# Patient Record
Sex: Male | Born: 1980 | Race: White | Hispanic: No | Marital: Married | Smoking: Never smoker
Health system: Southern US, Community
[De-identification: ages and names within clinical notes are randomized; demographics above are authoritative.]

## PROBLEM LIST (undated history)

## (undated) DIAGNOSIS — J45909 Unspecified asthma, uncomplicated: Secondary | ICD-10-CM

## (undated) DIAGNOSIS — T7840XA Allergy, unspecified, initial encounter: Secondary | ICD-10-CM

## (undated) HISTORY — DX: Allergy, unspecified, initial encounter: T78.40XA

## (undated) HISTORY — PX: ANTERIOR CRUCIATE LIGAMENT REPAIR: SHX115

## (undated) HISTORY — PX: EYE SURGERY: SHX253

---

## 2001-04-20 HISTORY — PX: KNEE SURGERY: SHX244

## 2014-10-15 DIAGNOSIS — T7840XA Allergy, unspecified, initial encounter: Secondary | ICD-10-CM | POA: Insufficient documentation

## 2014-10-16 ENCOUNTER — Ambulatory Visit: Payer: Self-pay | Admitting: Family Medicine

## 2015-04-09 ENCOUNTER — Telehealth: Payer: Self-pay | Admitting: Unknown Physician Specialty

## 2015-04-09 NOTE — Telephone Encounter (Signed)
Pt has sore throat and is coughing and he's not sure if it's viral. He has a 34 year old and he just wondered if this was something he needed to be worried about.

## 2015-04-09 NOTE — Telephone Encounter (Signed)
I believe this is your patient. Looking in PP, he used to see [place], saw Dr. Sherie DonLada twice, and [facility]

## 2015-04-09 NOTE — Telephone Encounter (Signed)
He woke up yesterday with a bit of a sore throat; then went away and was feeling better; little bit of cough; just scratchy, better through the day; no fever; no rash; no sinus or ear problems; discussed may be viral, symptomatic care encouraged; if sore throat worse, then consider strep, would need to be evaluated and treated in case strep, untreated strep can cause rheumatic fever; contagious, good hygiene around child discussed; we can see him if needed; he thanked me for call

## 2015-04-09 NOTE — Telephone Encounter (Signed)
Routing to provider for advice.

## 2015-05-15 ENCOUNTER — Emergency Department
Admission: EM | Admit: 2015-05-15 | Discharge: 2015-05-15 | Disposition: A | Discharge status: Home / Self Care | Payer: BC Managed Care – PPO | Attending: Emergency Medicine | Admitting: Emergency Medicine

## 2015-05-15 DIAGNOSIS — K529 Noninfective gastroenteritis and colitis, unspecified: Secondary | ICD-10-CM | POA: Insufficient documentation

## 2015-05-15 DIAGNOSIS — E860 Dehydration: Secondary | ICD-10-CM | POA: Insufficient documentation

## 2015-05-15 DIAGNOSIS — B349 Viral infection, unspecified: Secondary | ICD-10-CM | POA: Diagnosis not present

## 2015-05-15 DIAGNOSIS — R112 Nausea with vomiting, unspecified: Secondary | ICD-10-CM | POA: Diagnosis present

## 2015-05-15 HISTORY — DX: Unspecified asthma, uncomplicated: J45.909

## 2015-05-15 LAB — CBC
HCT: 50.8 % (ref 40.0–52.0)
Hemoglobin: 16.7 g/dL (ref 13.0–18.0)
MCH: 28.8 pg (ref 26.0–34.0)
MCHC: 32.8 g/dL (ref 32.0–36.0)
MCV: 87.9 fL (ref 80.0–100.0)
PLATELETS: 236 10*3/uL (ref 150–440)
RBC: 5.78 MIL/uL (ref 4.40–5.90)
RDW: 12.6 % (ref 11.5–14.5)
WBC: 15 10*3/uL — AB (ref 3.8–10.6)

## 2015-05-15 LAB — BASIC METABOLIC PANEL
ANION GAP: 7 (ref 5–15)
BUN: 19 mg/dL (ref 6–20)
CALCIUM: 9.5 mg/dL (ref 8.9–10.3)
CO2: 27 mmol/L (ref 22–32)
Chloride: 103 mmol/L (ref 101–111)
Creatinine, Ser: 1.49 mg/dL — ABNORMAL HIGH (ref 0.61–1.24)
GFR, EST NON AFRICAN AMERICAN: 60 mL/min — AB (ref >60)
Glucose, Bld: 140 mg/dL — ABNORMAL HIGH (ref 65–99)
POTASSIUM: 5.3 mmol/L — AB (ref 3.5–5.1)
SODIUM: 137 mmol/L (ref 135–145)

## 2015-05-15 MED ORDER — SODIUM CHLORIDE 0.9 % IV BOLUS (SEPSIS)
1000.0000 mL | Freq: Once | INTRAVENOUS | Status: AC
  Administered 2015-05-15: 1000 mL via INTRAVENOUS

## 2015-05-15 MED ORDER — ONDANSETRON 8 MG PO TBDP: 8.0000 mg | ORAL_TABLET | Freq: Three times a day (TID) | ORAL | Status: DC | PRN

## 2015-05-15 MED ORDER — ONDANSETRON HCL 4 MG/2ML IJ SOLN
4.0000 mg | Freq: Once | INTRAMUSCULAR | Status: AC
  Administered 2015-05-15: 4 mg via INTRAVENOUS
  Filled 2015-05-15: qty 2

## 2015-05-15 MED ORDER — RANITIDINE HCL 150 MG PO CAPS: 150.0000 mg | ORAL_CAPSULE | Freq: Two times a day (BID) | ORAL | Status: DC

## 2015-05-15 MED ORDER — DEXAMETHASONE SODIUM PHOSPHATE 10 MG/ML IJ SOLN
10.0000 mg | Freq: Once | INTRAMUSCULAR | Status: AC
  Administered 2015-05-15: 10 mg via INTRAVENOUS
  Filled 2015-05-15 (×2): qty 1

## 2015-05-15 NOTE — ED Notes (Signed)
Pt arrived via EMS with 18 ga in left ac.   IV was removed at this time, catheter intact.  Site benign.   Dressing applied. Pt states that he is feeling much better.

## 2015-05-15 NOTE — ED Notes (Signed)
Patient to ED via EMS for vomiting that started around 5pm tonight. States he has been at  [employer] and returned home at 0600 this morning. Also is checking his young son in with same symptoms. Patient appears well, skin normal color for ethnicity, mucus membranes moist. Not actively vomiting in triage.

## 2015-05-15 NOTE — Discharge Instructions (Signed)
Dehydration, Adult °Dehydration is a condition in which you do not have enough fluid or water in your body. It happens when you take in less fluid than you lose. Vital organs such as the kidneys, brain, and heart cannot function without a proper amount of fluids. Any loss of fluids from the body can cause dehydration.  °Dehydration can range from mild to severe. This condition should be treated right away to help prevent it from becoming severe. °CAUSES  °This condition may be caused by: °· Vomiting. °· Diarrhea. °· Excessive sweating, such as when exercising in hot or humid weather. °· Not drinking enough fluid during strenuous exercise or during an illness. °· Excessive urine output. °· Fever. °· Certain medicines. °RISK FACTORS °This condition is more likely to develop in: °· People who are taking certain medicines that cause the body to lose excess fluid (diuretics).   °· People who have a chronic illness, such as diabetes, that may increase urination. °· Older adults.   °· People who live at high altitudes.   °· People who participate in endurance sports.   °SYMPTOMS  °Mild Dehydration °· Thirst. °· Dry lips. °· Slightly dry mouth. °· Dry, warm skin. °Moderate Dehydration °· Very dry mouth.   °· Muscle cramps.   °· Dark urine and decreased urine production.   °· Decreased tear production.   °· Headache.   °· Light-headedness, especially when you stand up from a sitting position.   °Severe Dehydration °· Changes in skin.   °· Cold and clammy skin.   °· Skin does not spring back quickly when lightly pinched and released.   °· Changes in body fluids.   °· Extreme thirst.   °· No tears.   °· Not able to sweat when body temperature is high, such as in hot weather.   °· Minimal urine production.   °· Changes in vital signs.   °· Rapid, weak pulse (more than 100 beats per minute when you are sitting still).   °· Rapid breathing.   °· Low blood pressure.   °· Other changes.   °· Sunken eyes.   °· Cold hands and feet.    °· Confusion. °· Lethargy and difficulty being awakened. °· Fainting (syncope).   °· Short-term weight loss.   °· Unconsciousness. °DIAGNOSIS  °This condition may be diagnosed based on your symptoms. You may also have tests to determine how severe your dehydration is. These tests may include:  °· Urine tests.   °· Blood tests.   °TREATMENT  °Treatment for this condition depends on the severity. Mild or moderate dehydration can often be treated at home. Treatment should be started right away. Do not wait until dehydration becomes severe. Severe dehydration needs to be treated at the hospital. °Treatment for Mild Dehydration °· Drinking plenty of water to replace the fluid you have lost.   °· Replacing minerals in your blood (electrolytes) that you may have lost.   °Treatment for Moderate Dehydration  °· Consuming oral rehydration solution (ORS). °Treatment for Severe Dehydration °· Receiving fluid through an IV tube.   °· Receiving electrolyte solution through a feeding tube that is passed through your nose and into your stomach (nasogastric tube or NG tube). °· Correcting any abnormalities in electrolytes. °HOME CARE INSTRUCTIONS  °· Drink enough fluid to keep your urine clear or pale yellow.   °· Drink water or fluid slowly by taking small sips. You can also try sucking on ice cubes.  °· Have food or beverages that contain electrolytes. Examples include bananas and sports drinks. °· Take over-the-counter and prescription medicines only as told by your health care provider.   °· Prepare ORS according to the manufacturer's instructions. Take sips   of ORS every 5 minutes until your urine returns to normal. °· If you have vomiting or diarrhea, continue to try to drink water, ORS, or both.   °· If you have diarrhea, avoid:   °¨ Beverages that contain caffeine.   °¨ Fruit juice.   °¨ Milk.    °¨ Carbonated soft drinks. °· Do not take salt tablets. This can lead to the condition of having too much sodium in your body  (hypernatremia).   °SEEK MEDICAL CARE IF: °· You cannot eat or drink without vomiting. °· You have had moderate diarrhea during a period of more than 24 hours. °· You have a fever. °SEEK IMMEDIATE MEDICAL CARE IF:  °· You have extreme thirst. °· You have severe diarrhea. °· You have not urinated in 6-8 hours, or you have urinated only a small amount of very dark urine. °· You have shriveled skin. °· You are dizzy, confused, or both. °  °This information is not intended to replace advice given to you by your health care provider. Make sure you discuss any questions you have with your health care provider. °  °Document Released: 04/06/2005 Document Revised: 12/26/2014 Document Reviewed: 08/22/2014 °Elsevier Interactive Patient Education ©2016 [employer] ° °Viral Infections °A viral infection can be caused by different types of viruses. Most viral infections are not serious and resolve on their own. However, some infections may cause severe symptoms and may lead to further complications. °SYMPTOMS °Viruses can frequently cause: °· Minor sore throat. °· Aches and pains. °· Headaches. °· Runny nose. °· Different types of rashes. °· Watery eyes. °· Tiredness. °· Cough. °· Loss of appetite. °· Gastrointestinal infections, resulting in nausea, vomiting, and diarrhea. °These symptoms do not respond to antibiotics because the infection is not caused by bacteria. However, you might catch a bacterial infection following the viral infection. This is sometimes called a "superinfection." Symptoms of such a bacterial infection may include: °· Worsening sore throat with pus and difficulty swallowing. °· Swollen neck glands. °· Chills and a high or persistent fever. °· Severe headache. °· Tenderness over the sinuses. °· Persistent overall ill feeling (malaise), muscle aches, and tiredness (fatigue). °· Persistent cough. °· Yellow, green, or brown mucus production with coughing. °HOME CARE INSTRUCTIONS  °· Only take  over-the-counter or prescription medicines for pain, discomfort, diarrhea, or fever as directed by your caregiver. °· Drink enough water and fluids to keep your urine clear or pale yellow. Sports drinks can provide valuable electrolytes, sugars, and hydration. °· Get plenty of rest and maintain proper nutrition. Soups and broths with crackers or rice are fine. °SEEK IMMEDIATE MEDICAL CARE IF:  °· You have severe headaches, shortness of breath, chest pain, neck pain, or an unusual rash. °· You have uncontrolled vomiting, diarrhea, or you are unable to keep down fluids. °· You or your child has an oral temperature above 102° F (38.9° C), not controlled by medicine. °· Your baby is older than 3 months with a rectal temperature of 102° F (38.9° C) or higher. °· Your baby is 3 months old or younger with a rectal temperature of 100.4° F (38° C) or higher. °MAKE SURE YOU:  °· Understand these instructions. °· Will watch your condition. °· Will get help right away if you are not doing well or get worse. °  °This information is not intended to replace advice given to you by your health care provider. Make sure you discuss any questions you have with your health care provider. °  °Document Released: 01/14/2005 Document Revised: 06/29/2011 Document   Reviewed: 09/12/2014 °Elsevier Interactive Patient Education ©2016 [employer] ° °

## 2015-05-15 NOTE — ED Provider Notes (Signed)
[facility] Emergency Department Provider Note  ____________________________________________  Time seen: 10:00 PM  I have reviewed the triage vital signs and the nursing notes.   HISTORY  Chief Complaint Emesis    HPI Kenneth Schnebly is a 35 y.o. male who complains of nausea vomiting and diarrhea today. He recently traveled with his family to [employer] and since returning home, he, his son, and his mother-in-law are all sick with the same symptoms. He has generalized abdominal discomfort and fatigue. Denies fever, but while being upright earlier today he fell very lightheaded and thought he was going to pass out.     Past Medical History  Diagnosis Date  . Allergy   . Asthma      Patient Active Problem List   Diagnosis Date Noted  . Allergy      Past Surgical History  Procedure Laterality Date  . Knee surgery  2003    ACL reconstruction  . Eye surgery      retinal repair     Current Outpatient Rx  Name  Route  Sig  Dispense  Refill  . ondansetron (ZOFRAN ODT) 8 MG disintegrating tablet   Oral   Take 1 tablet (8 mg total) by mouth every 8 (eight) hours as needed for nausea or vomiting.   20 tablet   0   . ranitidine (ZANTAC) 150 MG capsule   Oral   Take 1 capsule (150 mg total) by mouth 2 (two) times daily.   28 capsule   0      Allergies Review of patient's allergies indicates no known allergies.   No family history on file.  Social History Social History  Substance Use Topics  . Smoking status: Never Smoker   . Smokeless tobacco: Never Used  . Alcohol Use: Yes     Comment:   Occasionally    Review of Systems  Constitutional:   No fever or chills. No weight changes Eyes:   No blurry vision or double vision.  ENT:   No sore throat. Cardiovascular:   No chest pain. Respiratory:   No dyspnea or cough. Gastrointestinal:   Generalized abdominal pain with vomiting and diarrhea.  No BRBPR or melena. Genitourinary:    Negative for dysuria, urinary retention, bloody urine, or difficulty urinating. Musculoskeletal:   Negative for back pain. No joint swelling or pain. Skin:   Negative for rash. Neurological:   Negative for headaches, focal weakness or numbness. Psychiatric:  No anxiety or depression.   Endocrine:  No hot/cold intolerance, changes in energy, or sleep difficulty.  10-point ROS otherwise negative.  ____________________________________________   PHYSICAL EXAM:  VITAL SIGNS: ED Triage Vitals  Enc Vitals Group     BP 05/15/15 2025 99/61 mmHg     Pulse Rate 05/15/15 2025 65     Resp 05/15/15 2025 18     Temp 05/15/15 2025 98.2 F (36.8 C)     Temp Source 05/15/15 2025 Oral     SpO2 05/15/15 2025 96 %     Weight 05/15/15 2026 188 lb (85.276 kg)     Height 05/15/15 2026  (1.854 m)     Head Cir --      Peak Flow --      Pain Score 05/15/15 2026 0     Pain Loc --      Pain Edu? --      Excl. in GC? --     Vital signs reviewed, nursing assessments reviewed.   Constitutional:  Alert and oriented. Well appearing and in no distress. Eyes:   No scleral icterus. No conjunctival pallor. PERRL. EOMI ENT   Head:   Normocephalic and atraumatic.   Nose:   No congestion/rhinnorhea. No septal hematoma   Mouth/Throat:   MMM, positive pharyngeal erythema. No peritonsillar mass. No uvula shift.   Neck:   No stridor. No SubQ emphysema. No meningismus. Hematological/Lymphatic/Immunilogical:   No cervical lymphadenopathy. Cardiovascular:   RRR. Normal and symmetric distal pulses are present in all extremities. No murmurs, rubs, or gallops. Respiratory:   Normal respiratory effort without tachypnea nor retractions. Breath sounds are clear and equal bilaterally. No wheezes/rales/rhonchi. Gastrointestinal:   Soft with mild generalized tenderness. No focal findings. No distention. There is no CVA tenderness.  No rebound, rigidity, or guarding. Genitourinary:    deferred Musculoskeletal:   Nontender with normal range of motion in all extremities. No joint effusions.  No lower extremity tenderness.  No edema. Neurologic:   Normal speech and language.  CN 2-10 normal. Motor grossly intact. No pronator drift.  Normal gait. No gross focal neurologic deficits are appreciated.  Skin:    Skin is warm, dry and intact. No rash noted.  No petechiae, purpura, or bullae. Psychiatric:   Mood and affect are normal. Speech and behavior are normal. Patient exhibits appropriate insight and judgment.  ____________________________________________    LABS (pertinent positives/negatives) (all labs ordered are listed, but only abnormal results are displayed) Labs Reviewed  CBC - Abnormal; Notable for the following:    WBC 15.0 (*)    All other components within normal limits  BASIC METABOLIC PANEL - Abnormal; Notable for the following:    Potassium 5.3 (*)    Glucose, Bld 140 (*)    Creatinine, Ser 1.49 (*)    GFR calc non Af Amer 60 (*)    All other components within normal limits   ____________________________________________   EKG    ____________________________________________    RADIOLOGY    ____________________________________________   PROCEDURES   ____________________________________________   INITIAL IMPRESSION / ASSESSMENT AND PLAN / ED COURSE  Pertinent labs & imaging results that were available during my care of the patient were reviewed by me and considered in my medical decision making (see chart for details).  Patient presents with nausea vomiting diarrhea and generalized abdominal pain this setting of recent travel to [place] and multiple sick contacts. This is consistent with Arna Medici virus and gastroenteritis. We'll give Decadron and Zofran and IV fluids especially since he was subclinically orthostatic on nursing assessment.Considering the patient's symptoms, medical history, and physical examination today, I have low suspicion  for cholecystitis or biliary pathology, pancreatitis, perforation or bowel obstruction, hernia, intra-abdominal abscess, AAA or dissection, volvulus or intussusception, or appendicitis.  Expect the patient to be discharged home after receiving fluids and I he'll feel much better. We'll continue Zantac and Zofran for symptomatic relief at home.     ____________________________________________   FINAL CLINICAL IMPRESSION(S) / ED DIAGNOSES  Final diagnoses:  Viral syndrome  Dehydration  Gastroenteritis      Sharman Cheek, MD 05/15/15 2226

## 2015-05-15 NOTE — ED Notes (Signed)
ADDENDUM: Patient also states he was wheezing at the time of vomiting and had to use his inhaler while he was on the phone with 911.

## 2015-05-15 NOTE — ED Notes (Signed)
Called for pt in the lobby, but did not get an answer.

## 2015-09-10 ENCOUNTER — Encounter: Payer: Self-pay | Admitting: Family Medicine

## 2015-09-10 ENCOUNTER — Ambulatory Visit (INDEPENDENT_AMBULATORY_CARE_PROVIDER_SITE_OTHER): Payer: BC Managed Care – PPO | Admitting: Family Medicine

## 2015-09-10 VITALS — BP 145/80 | HR 109 | Temp 99.1°F | Ht 73.0 in | Wt 191.6 lb

## 2015-09-10 DIAGNOSIS — J069 Acute upper respiratory infection, unspecified: Secondary | ICD-10-CM | POA: Diagnosis not present

## 2015-09-10 NOTE — Progress Notes (Signed)
BP 145/80 mmHg  Pulse 109  Temp(Src) 99.1 F (37.3 C)  Ht 6\' 1"  (1.854 m)  Wt 191 lb 9.6 oz (86.909 kg)  BMI 25.28 kg/m2  SpO2 97%   Subjective:    Patient ID: Kenneth Rivers, male    DOB: [birth year], 35 y.o.   MRN: [id number]  HPI: Kenneth Rivers is a 35 y.o. male  Chief Complaint  Patient presents with  . Hoarse  . Chills  . Fatigue  . Generalized Body Aches  . Headache   UPPER RESPIRATORY TRACT INFECTION Duration: 1 day Worst symptom: hoarseness, chills and sweats Fever: no Cough: yes Shortness of breath: yes Wheezing: no Chest pain: no Chest tightness: no Chest congestion: no Nasal congestion: yes Runny nose: yes Post nasal drip: no Sneezing: yes Sore throat: no Swollen glands: no Sinus pressure: yes Headache: yes Face pain: no Toothache: no Ear pain: no  Ear pressure: no  Eyes red/itching:no Eye drainage/crusting: no  Vomiting: no Rash: no Fatigue: yes Sick contacts: yes Strep contacts: no  Context: worse Recurrent sinusitis: no Relief with OTC cold/cough medications: no  Treatments attempted: dayquil   Relevant past medical, surgical, family and social history reviewed and updated as indicated. Interim medical history since our last visit reviewed. Allergies and medications reviewed and updated.  Review of Systems  Constitutional: Positive for fever, chills, diaphoresis and fatigue. Negative for activity change, appetite change and unexpected weight change.  HENT: Positive for congestion, rhinorrhea, sinus pressure, sneezing and voice change. Negative for dental problem, drooling, ear discharge, ear pain, facial swelling, hearing loss, mouth sores, nosebleeds, postnasal drip, sore throat and trouble swallowing.   Respiratory: Positive for cough. Negative for apnea, choking, chest tightness, shortness of breath, wheezing and stridor.   Cardiovascular: Negative.   Psychiatric/Behavioral: Negative.     Per HPI unless specifically indicated  above     Objective:    BP 145/80 mmHg  Pulse 109  Temp(Src) 99.1 F (37.3 C)  Ht 6\' 1"  (1.854 m)  Wt 191 lb 9.6 oz (86.909 kg)  BMI 25.28 kg/m2  SpO2 97%  Wt Readings from Last 3 Encounters:  09/10/15 191 lb 9.6 oz (86.909 kg)  05/15/15 188 lb (85.276 kg)  06/07/14 193 lb (87.544 kg)    Physical Exam  Constitutional: He is oriented to person, place, and time. He appears well-developed and well-nourished. No distress.  HENT:  Head: Normocephalic and atraumatic.  Right Ear: Hearing, tympanic membrane, external ear and ear canal normal.  Left Ear: Hearing, tympanic membrane, external ear and ear canal normal.  Nose: Mucosal edema and rhinorrhea present. Right sinus exhibits no maxillary sinus tenderness and no frontal sinus tenderness. Left sinus exhibits no maxillary sinus tenderness and no frontal sinus tenderness.  Mouth/Throat: Uvula is midline, oropharynx is clear and moist and mucous membranes are normal. No oropharyngeal exudate.  Eyes: Conjunctivae, EOM and lids are normal. Pupils are equal, round, and reactive to light. Right eye exhibits no discharge. Left eye exhibits no discharge. No scleral icterus.  Neck: Normal range of motion. Neck supple. No JVD present. No tracheal deviation present. No thyromegaly present.  Cardiovascular: Normal rate, regular rhythm, normal heart sounds and intact distal pulses.  Exam reveals no gallop and no friction rub.   No murmur heard. Pulmonary/Chest: Effort normal and breath sounds normal. No stridor. No respiratory distress. He has no wheezes. He has no rales. He exhibits no tenderness.  Musculoskeletal: Normal range of motion.  Lymphadenopathy:    He has cervical adenopathy.  Neurological: He is alert and oriented to person, place, and time.  Skin: Skin is warm, dry and intact. No rash noted. He is not diaphoretic. No erythema. No pallor.  Psychiatric: He has a normal mood and affect. His speech is normal and behavior is normal.  Judgment and thought content normal. Cognition and memory are normal.  Nursing note and vitals reviewed.   Results for orders placed or performed during the hospital encounter of 05/15/15  CBC  Result Value Ref Range   WBC 15.0 (H) 3.8 - 10.6 K/uL   RBC 5.78 4.40 - 5.90 MIL/uL   Hemoglobin 16.7 13.0 - 18.0 g/dL   HCT 16.1 09.6 - 04.5 %   MCV 87.9 80.0 - 100.0 fL   MCH 28.8 26.0 - 34.0 pg   MCHC 32.8 32.0 - 36.0 g/dL   RDW 40.9 81.1 - 91.4 %   Platelets 236 150 - 440 K/uL  Basic metabolic panel  Result Value Ref Range   Sodium 137 135 - 145 mmol/L   Potassium 5.3 (H) 3.5 - 5.1 mmol/L   Chloride 103 101 - 111 mmol/L   CO2 27 22 - 32 mmol/L   Glucose, Bld 140 (H) 65 - 99 mg/dL   BUN 19 6 - 20 mg/dL   Creatinine, Ser 7.82 (H) 0.61 - 1.24 mg/dL   Calcium 9.5 8.9 - 95.6 mg/dL   GFR calc non Af Amer 60 (L) >60 mL/min   GFR calc Af Amer >60 >60 mL/min   Anion gap 7 5 - 15      Assessment & Plan:   Problem List Items Addressed This Visit    None    Visit Diagnoses    Upper respiratory infection    -  Primary    Rest and fluids. Out of work until Friday. Call if not getting better or getting worse.         Follow up plan: Return if symptoms worsen or fail to improve.

## 2015-09-10 NOTE — Patient Instructions (Signed)

## 2016-04-17 ENCOUNTER — Encounter: Payer: Self-pay | Admitting: Emergency Medicine

## 2016-04-17 ENCOUNTER — Emergency Department
Admission: EM | Admit: 2016-04-17 | Discharge: 2016-04-17 | Disposition: A | Discharge status: Home / Self Care | Payer: BC Managed Care – PPO | Attending: Emergency Medicine | Admitting: Emergency Medicine

## 2016-04-17 DIAGNOSIS — R42 Dizziness and giddiness: Secondary | ICD-10-CM | POA: Insufficient documentation

## 2016-04-17 DIAGNOSIS — J45909 Unspecified asthma, uncomplicated: Secondary | ICD-10-CM | POA: Diagnosis not present

## 2016-04-17 LAB — CBC
HCT: 43.3 % (ref 40.0–52.0)
HEMOGLOBIN: 14.6 g/dL (ref 13.0–18.0)
MCH: 30.4 pg (ref 26.0–34.0)
MCHC: 33.7 g/dL (ref 32.0–36.0)
MCV: 90 fL (ref 80.0–100.0)
Platelets: 251 10*3/uL (ref 150–440)
RBC: 4.82 MIL/uL (ref 4.40–5.90)
RDW: 13 % (ref 11.5–14.5)
WBC: 9.3 10*3/uL (ref 3.8–10.6)

## 2016-04-17 LAB — URINALYSIS, COMPLETE (UACMP) WITH MICROSCOPIC
BILIRUBIN URINE: NEGATIVE
Bacteria, UA: NONE SEEN
Glucose, UA: NEGATIVE mg/dL
HGB URINE DIPSTICK: NEGATIVE
Ketones, ur: NEGATIVE mg/dL
LEUKOCYTES UA: NEGATIVE
NITRITE: NEGATIVE
PROTEIN: NEGATIVE mg/dL
Specific Gravity, Urine: 1.005 (ref 1.005–1.030)
Squamous Epithelial / LPF: NONE SEEN
pH: 7 (ref 5.0–8.0)

## 2016-04-17 LAB — BASIC METABOLIC PANEL
ANION GAP: 5 (ref 5–15)
BUN: 24 mg/dL — ABNORMAL HIGH (ref 6–20)
CALCIUM: 9.1 mg/dL (ref 8.9–10.3)
CO2: 28 mmol/L (ref 22–32)
Chloride: 106 mmol/L (ref 101–111)
Creatinine, Ser: 1.04 mg/dL (ref 0.61–1.24)
Glucose, Bld: 104 mg/dL — ABNORMAL HIGH (ref 65–99)
Potassium: 3.6 mmol/L (ref 3.5–5.1)
Sodium: 139 mmol/L (ref 135–145)

## 2016-04-17 MED ORDER — MECLIZINE HCL 25 MG PO TABS: 25.0000 mg | ORAL_TABLET | Freq: Three times a day (TID) | ORAL | 0 refills | Status: DC | PRN

## 2016-04-17 MED ORDER — SODIUM CHLORIDE 0.9 % IV BOLUS (SEPSIS)
1000.0000 mL | Freq: Once | INTRAVENOUS | Status: AC
  Administered 2016-04-17: 1000 mL via INTRAVENOUS

## 2016-04-17 NOTE — ED Notes (Signed)
Patient will follow up with PCP.

## 2016-04-17 NOTE — ED Notes (Signed)
Attempted IV x2. 

## 2016-04-17 NOTE — ED Triage Notes (Signed)
Pt. States dizziness that started around 19:00 this evening.  Pt. States he ate at [employer] around 18:30.

## 2016-04-17 NOTE — ED Provider Notes (Signed)
[facility] Emergency Department Provider Note  Time seen: 10:38 PM  I have reviewed the triage vital signs and the nursing notes.   HISTORY  Chief Complaint Dizziness (Pt. here via EMS for dizziness.)    HPI Kenneth Rivers is a 35 y.o. male the patient presents the emergency department for dizziness. According to the patient he was sitting at home when he suddenly began feeling dizzy and lightheaded. Denies any chest pain, trouble breathing, nausea, diaphoresis. Denies any recent nausea, vomiting, diarrhea. Denies fever cough or congestion. Patient states mild dizziness currently but feels much better.  Past Medical History:  Diagnosis Date  . Allergy   . Asthma     Patient Active Problem List   Diagnosis Date Noted  . Allergy     Past Surgical History:  Procedure Laterality Date  . ANTERIOR CRUCIATE LIGAMENT REPAIR     rt. knee  . EYE SURGERY     retinal repair  . KNEE SURGERY  2003   ACL reconstruction    Prior to Admission medications   Medication Sig Start Date End Date Taking? Authorizing Provider  cetirizine (ZYRTEC) 10 MG tablet Take 10 mg by mouth daily.    Historical Provider, MD  fluticasone (FLONASE) 50 MCG/ACT nasal spray Place 1 spray into both nostrils daily.    Historical Provider, MD    No Known Allergies  History reviewed. No pertinent family history.  Social History Social History  Substance Use Topics  . Smoking status: Never Smoker  . Smokeless tobacco: Never Used  . Alcohol use Yes     Comment:   Occasionally    Review of Systems Constitutional: Negative for fever. Positive for dizziness, largely resolved. Cardiovascular: Negative for chest pain. Respiratory: Negative for shortness of breath. Gastrointestinal: Negative for abdominal pain Neurological: Negative for headache 10-point ROS otherwise negative.  ____________________________________________   PHYSICAL EXAM:  VITAL SIGNS: ED Triage Vitals   Enc Vitals Group     BP 04/17/16 2057 (!) 147/97     Pulse Rate 04/17/16 2057 73     Resp 04/17/16 2057 18     Temp 04/17/16 2057 98.2 F (36.8 C)     Temp Source 04/17/16 2057 Oral     SpO2 04/17/16 2057 97 %     Weight 04/17/16 2058 190 lb (86.2 kg)     Height 04/17/16 2058 6\' 1"  (1.854 m)     Head Circumference --      Peak Flow --      Pain Score 04/17/16 2058 4     Pain Loc --      Pain Edu? --      Excl. in GC? --    Constitutional: Alert and oriented. Well appearing and in no distress. Eyes: Normal exam ENT   Head: Normocephalic and atraumatic.   Mouth/Throat: Mucous membranes are moist. Cardiovascular: Normal rate, regular rhythm. No murmur Respiratory: Normal respiratory effort without tachypnea nor retractions. Breath sounds are clear  Gastrointestinal: Soft and nontender. No distention.  Musculoskeletal: Nontender with normal range of motion in all extremities.  Neurologic:  Normal speech and language. No gross focal neurologic deficits  Skin:  Skin is warm, dry and intact.  Psychiatric: Mood and affect are normal.   ____________________________________________    EKG  EKG reviewed and interpreted by myself shows sinus rhythm at 73 bpm, narrow QRS, normal axis, normal intervals, no concerning ST changes.  INITIAL IMPRESSION / ASSESSMENT AND PLAN / ED COURSE  Pertinent labs & imaging  results that were available during my care of the patient were reviewed by me and considered in my medical decision making (see chart for details).  Patient presents the emergency department with acute onset of dizziness. Patient states his symptoms have largely resolved. Patient's labs are largely within normal limits. Patient appears well with a normal physical examination. We will IV hydrate, anticipate discharge home with PCP follow-up.  ____________________________________________   FINAL CLINICAL IMPRESSION(S) / ED DIAGNOSES  Dizziness    [name],  MD 04/17/16 2241

## 2016-04-17 NOTE — ED Notes (Signed)
Pt. Reports getting dizzy soon after eating tonight around 19:00.  Pt. Denies anyone in the house with same symptoms.  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 Did a CO test and it came back as 1.  Pt. Reports dizziness was at 7/10 at its worst, pt. Reports 4/10 while sitting in room at this time.

## 2017-05-03 ENCOUNTER — Encounter: Payer: Self-pay | Admitting: Family Medicine

## 2017-05-03 ENCOUNTER — Ambulatory Visit: Payer: BC Managed Care – PPO | Admitting: Family Medicine

## 2017-05-03 VITALS — BP 133/87 | HR 53 | Temp 98.5°F | Ht 71.2 in | Wt 198.5 lb

## 2017-05-03 DIAGNOSIS — Z8249 Family history of ischemic heart disease and other diseases of the circulatory system: Secondary | ICD-10-CM

## 2017-05-03 DIAGNOSIS — R2991 Unspecified symptoms and signs involving the musculoskeletal system: Secondary | ICD-10-CM

## 2017-05-03 NOTE — Assessment & Plan Note (Signed)
Given Marfanoid habitus and family history of AAAs, will obtain US to R/O AAA. Avoid tobacco. Will check labs as well. Call with any concerns. Continue to keep BP and cholesterol under good control.

## 2017-05-03 NOTE — Progress Notes (Signed)
BP 133/87   Pulse (!) 53   Temp 98.5 F (36.9 C) (Oral)   Ht 5' 11.2" (1.808 m)   Wt 198 lb 8 oz (90 kg)   SpO2 99%   BMI 27.53 kg/m    Subjective:    Patient ID: Kenneth Rivers, male    DOB: 08-Nov-1980, 37 y.o.   MRN: [id number]  HPI: Kenneth Rivers is a 37 y.o. male  Chief Complaint  Patient presents with  . Thoracic Aortic Dissection    pt states that his father just recently passed away from an aortic dissection and was told to be checked because it is heriditary   Dad just passed away at 60 from an aortic dissection. He also notes that his grandfather died very young and they think it was from an aneurysm. He is healthy. Has always been very tall and thin with long arms and legs. Feeling well today with no other concerns or complaints at this time.    Relevant past medical, surgical, family and social history reviewed and updated as indicated. Interim medical history since our last visit reviewed. Allergies and medications reviewed and updated.  Review of Systems  Constitutional: Negative.   Respiratory: Negative.   Cardiovascular: Negative.   Musculoskeletal: Negative.   Neurological: Negative.   Psychiatric/Behavioral: Negative.     Per HPI unless specifically indicated above     Objective:    BP 133/87   Pulse (!) 53   Temp 98.5 F (36.9 C) (Oral)   Ht 5' 11.2" (1.808 m)   Wt 198 lb 8 oz (90 kg)   SpO2 99%   BMI 27.53 kg/m   Wt Readings from Last 3 Encounters:  05/03/17 198 lb 8 oz (90 kg)  04/17/16 190 lb (86.2 kg)  09/10/15 191 lb 9.6 oz (86.9 kg)    Physical Exam  Constitutional: He is oriented to person, place, and time. He appears well-developed and well-nourished. No distress.  HENT:  Head: Normocephalic and atraumatic.  Right Ear: Hearing normal.  Left Ear: Hearing normal.  Nose: Nose normal.  Eyes: Conjunctivae and lids are normal. Right eye exhibits no discharge. Left eye exhibits no discharge. No scleral icterus.  Cardiovascular:  Normal rate, regular rhythm, normal heart sounds and intact distal pulses. Exam reveals no gallop and no friction rub.  No murmur heard. Pulmonary/Chest: Effort normal and breath sounds normal. No respiratory distress. He has no wheezes. He has no rales. He exhibits no tenderness.  Abdominal: Soft. Bowel sounds are normal. He exhibits no distension and no mass. There is no tenderness. There is no rebound and no guarding.  Musculoskeletal: Normal range of motion.  Neurological: He is alert and oriented to person, place, and time.  Skin: Skin is warm, dry and intact. No rash noted. He is not diaphoretic. No erythema. No pallor.  Psychiatric: He has a normal mood and affect. His speech is normal and behavior is normal. Judgment and thought content normal. Cognition and memory are normal.  Nursing note and vitals reviewed.   Results for orders placed or performed during the hospital encounter of 04/17/16  Basic metabolic panel  Result Value Ref Range   Sodium 139 135 - 145 mmol/L   Potassium 3.6 3.5 - 5.1 mmol/L   Chloride 106 101 - 111 mmol/L   CO2 28 22 - 32 mmol/L   Glucose, Bld 104 (H) 65 - 99 mg/dL   BUN 24 (H) 6 - 20 mg/dL   Creatinine, Ser 4.09 0.61 - 1.24  mg/dL   Calcium 9.1 8.9 - 40.910.3 mg/dL   GFR calc non Af Amer >60 >60 mL/min   GFR calc Af Amer >60 >60 mL/min   Anion gap 5 5 - 15  CBC  Result Value Ref Range   WBC 9.3 3.8 - 10.6 K/uL   RBC 4.82 4.40 - 5.90 MIL/uL   Hemoglobin 14.6 13.0 - 18.0 g/dL   HCT 81.143.3 91.440.0 - 78.252.0 %   MCV 90.0 80.0 - 100.0 fL   MCH 30.4 26.0 - 34.0 pg   MCHC 33.7 32.0 - 36.0 g/dL   RDW 95.613.0 21.311.5 - 08.614.5 %   Platelets 251 150 - 440 K/uL  Urinalysis, Complete w Microscopic  Result Value Ref Range   Color, Urine COLORLESS (A) YELLOW   APPearance CLEAR (A) CLEAR   Specific Gravity, Urine 1.005 1.005 - 1.030   pH 7.0 5.0 - 8.0   Glucose, UA NEGATIVE NEGATIVE mg/dL   Hgb urine dipstick NEGATIVE NEGATIVE   Bilirubin Urine NEGATIVE NEGATIVE   Ketones,  ur NEGATIVE NEGATIVE mg/dL   Protein, ur NEGATIVE NEGATIVE mg/dL   Nitrite NEGATIVE NEGATIVE   Leukocytes, UA NEGATIVE NEGATIVE   RBC / HPF 0-5 0 - 5 RBC/hpf   WBC, UA 0-5 0 - 5 WBC/hpf   Bacteria, UA NONE SEEN NONE SEEN   Squamous Epithelial / LPF NONE SEEN NONE SEEN      Assessment & Plan:   Problem List Items Addressed This Visit      Other   Family history of abdominal aortic aneurysm (AAA) - Primary    Given Marfanoid habitus and family history of AAAs, will obtain US to R/O AAA. Avoid tobacco. Will check labs as well. Call with any concerns. Continue to keep BP and cholesterol under good control.       Relevant Orders   CBC with Differential/Platelet   Comprehensive metabolic panel   Lipid Panel w/o Chol/HDL Ratio   TSH   UA/M w/rflx Culture, Routine   US Retroperitoneal Ltd    Other Visit Diagnoses    Marfanoid habitus       Relevant Orders   CBC with Differential/Platelet   Comprehensive metabolic panel   Lipid Panel w/o Chol/HDL Ratio   TSH   UA/M w/rflx Culture, Routine   US Retroperitoneal Ltd       Follow up plan: Return in about 4 weeks (around 05/31/2017) for Physical.

## 2017-05-11 ENCOUNTER — Other Ambulatory Visit: Payer: BC Managed Care – PPO

## 2017-05-11 ENCOUNTER — Telehealth: Payer: Self-pay | Admitting: Family Medicine

## 2017-05-11 ENCOUNTER — Ambulatory Visit
Admission: RE | Admit: 2017-05-11 | Discharge: 2017-05-11 | Disposition: A | Discharge status: Home / Self Care | Payer: BC Managed Care – PPO | Source: Ambulatory Visit | Attending: Family Medicine | Admitting: Family Medicine

## 2017-05-11 DIAGNOSIS — R2991 Unspecified symptoms and signs involving the musculoskeletal system: Secondary | ICD-10-CM | POA: Diagnosis not present

## 2017-05-11 DIAGNOSIS — Z8249 Family history of ischemic heart disease and other diseases of the circulatory system: Secondary | ICD-10-CM | POA: Insufficient documentation

## 2017-05-11 LAB — MICROSCOPIC EXAMINATION: Bacteria, UA: NONE SEEN

## 2017-05-11 LAB — UA/M W/RFLX CULTURE, ROUTINE
BILIRUBIN UA: NEGATIVE
Glucose, UA: NEGATIVE
KETONES UA: NEGATIVE
Leukocytes, UA: NEGATIVE
NITRITE UA: NEGATIVE
PH UA: 8.5 — AB (ref 5.0–7.5)
Protein, UA: NEGATIVE
Specific Gravity, UA: 1.015 (ref 1.005–1.030)
Urobilinogen, Ur: 0.2 mg/dL (ref 0.2–1.0)

## 2017-05-11 NOTE — Telephone Encounter (Signed)
Called to let him know that his US was normal. No sign of any aneurysm. We'll check him again in about 5 years or so. OK to give him this message if he calls. CRM generated.

## 2017-05-11 NOTE — Telephone Encounter (Signed)
Informed patient in person

## 2017-05-12 ENCOUNTER — Encounter: Payer: Self-pay | Admitting: Family Medicine

## 2017-05-12 LAB — CBC WITH DIFFERENTIAL/PLATELET
BASOS: 1 %
Basophils Absolute: 0 10*3/uL (ref 0.0–0.2)
EOS (ABSOLUTE): 0.2 10*3/uL (ref 0.0–0.4)
Eos: 2 %
HEMATOCRIT: 44 % (ref 37.5–51.0)
Hemoglobin: 15 g/dL (ref 13.0–17.7)
IMMATURE GRANULOCYTES: 0 %
Immature Grans (Abs): 0 10*3/uL (ref 0.0–0.1)
LYMPHS ABS: 2.4 10*3/uL (ref 0.7–3.1)
Lymphs: 35 %
MCH: 30.4 pg (ref 26.6–33.0)
MCHC: 34.1 g/dL (ref 31.5–35.7)
MCV: 89 fL (ref 79–97)
MONOS ABS: 0.7 10*3/uL (ref 0.1–0.9)
Monocytes: 10 %
NEUTROS PCT: 52 %
Neutrophils Absolute: 3.6 10*3/uL (ref 1.4–7.0)
PLATELETS: 232 10*3/uL (ref 150–379)
RBC: 4.93 x10E6/uL (ref 4.14–5.80)
RDW: 13.3 % (ref 12.3–15.4)
WBC: 6.8 10*3/uL (ref 3.4–10.8)

## 2017-05-12 LAB — COMPREHENSIVE METABOLIC PANEL
A/G RATIO: 1.9 (ref 1.2–2.2)
ALBUMIN: 4.9 g/dL (ref 3.5–5.5)
ALK PHOS: 66 IU/L (ref 39–117)
ALT: 26 IU/L (ref 0–44)
AST: 26 IU/L (ref 0–40)
BILIRUBIN TOTAL: 0.6 mg/dL (ref 0.0–1.2)
BUN / CREAT RATIO: 13 (ref 9–20)
BUN: 16 mg/dL (ref 6–20)
CO2: 27 mmol/L (ref 20–29)
Calcium: 9.6 mg/dL (ref 8.7–10.2)
Chloride: 102 mmol/L (ref 96–106)
Creatinine, Ser: 1.19 mg/dL (ref 0.76–1.27)
GFR calc Af Amer: 90 mL/min/{1.73_m2} (ref >59)
GFR calc non Af Amer: 78 mL/min/{1.73_m2} (ref >59)
GLOBULIN, TOTAL: 2.6 g/dL (ref 1.5–4.5)
Glucose: 85 mg/dL (ref 65–99)
POTASSIUM: 5 mmol/L (ref 3.5–5.2)
SODIUM: 142 mmol/L (ref 134–144)
Total Protein: 7.5 g/dL (ref 6.0–8.5)

## 2017-05-12 LAB — LIPID PANEL W/O CHOL/HDL RATIO
CHOLESTEROL TOTAL: 159 mg/dL (ref 100–199)
HDL: 42 mg/dL (ref >39)
LDL Calculated: 98 mg/dL (ref 0–99)
TRIGLYCERIDES: 95 mg/dL (ref 0–149)
VLDL CHOLESTEROL CAL: 19 mg/dL (ref 5–40)

## 2017-05-12 LAB — TSH: TSH: 1.35 u[IU]/mL (ref 0.450–4.500)

## 2018-09-20 IMAGING — US US RETROPERITONEAL COMPLETE
1 series · 14 of 25 positions shown · non-contrast
Comparison: None.

CLINICAL DATA: Family history of AAA

EXAM:
ULTRASOUND RETROPERITONEAL COMPLETE
TECHNIQUE: Ultrasound examination of the abdominal aorta was performed to
evaluate for abdominal aortic aneurysm. The common iliac arteries,
IVC, and kidneys were also evaluated.

[Series 1: us retroperitoneal complete · 0.25mm/px · 14 of 58 slices shown]
[im 1/58]
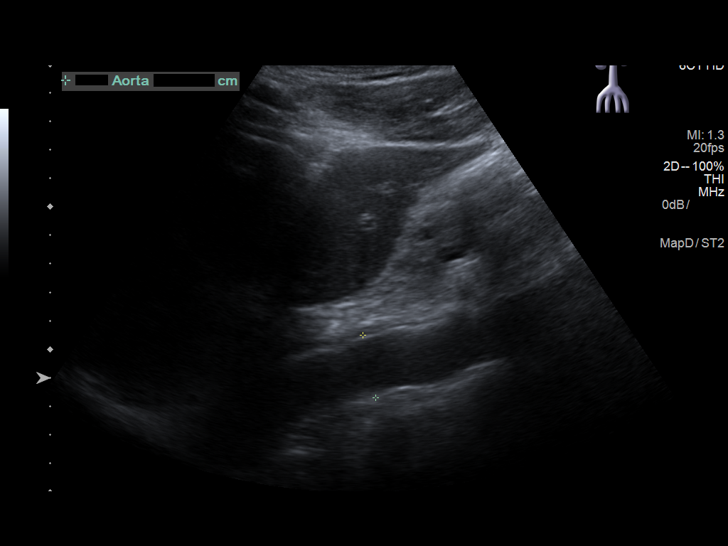
[im 5/58]
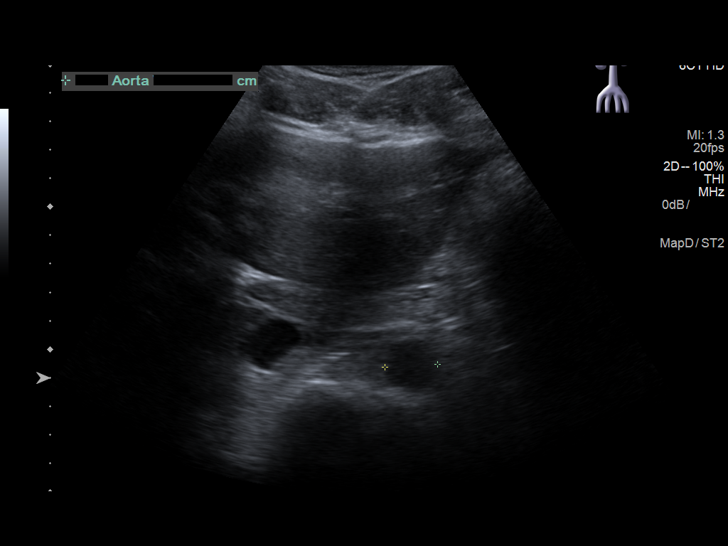
[im 10/58]
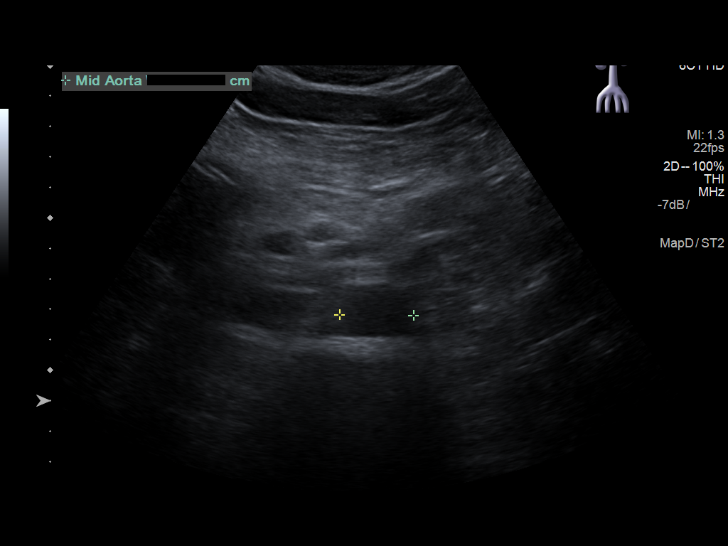
[im 15/58]
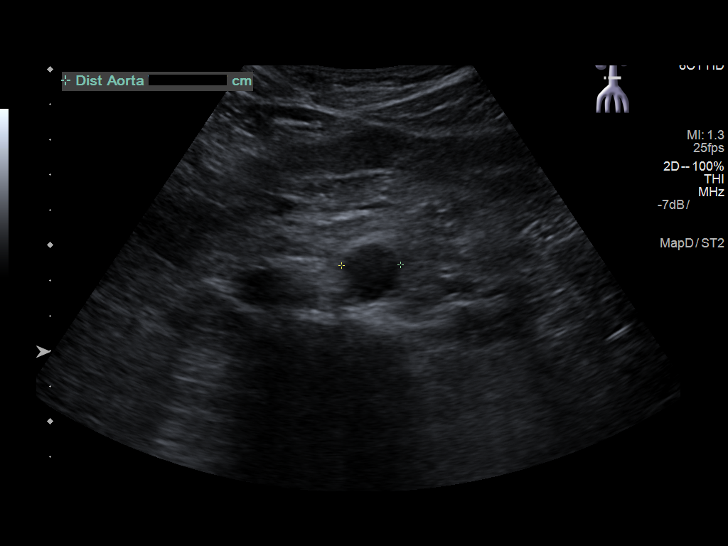
[im 20/58]
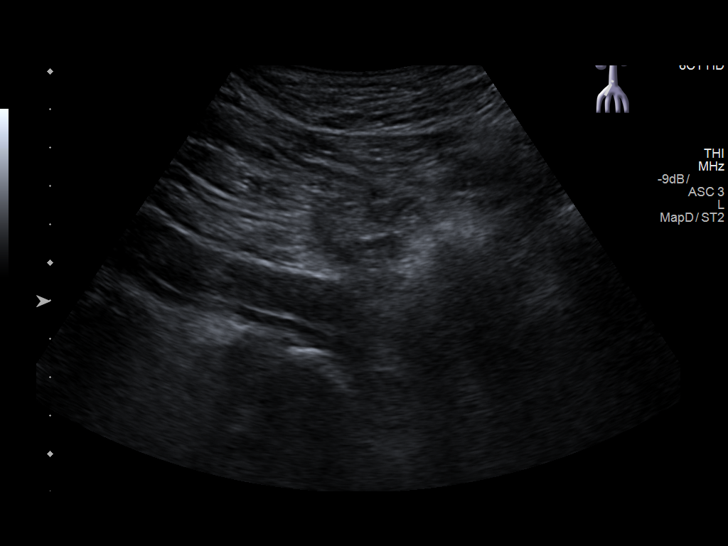
[im 22/58]
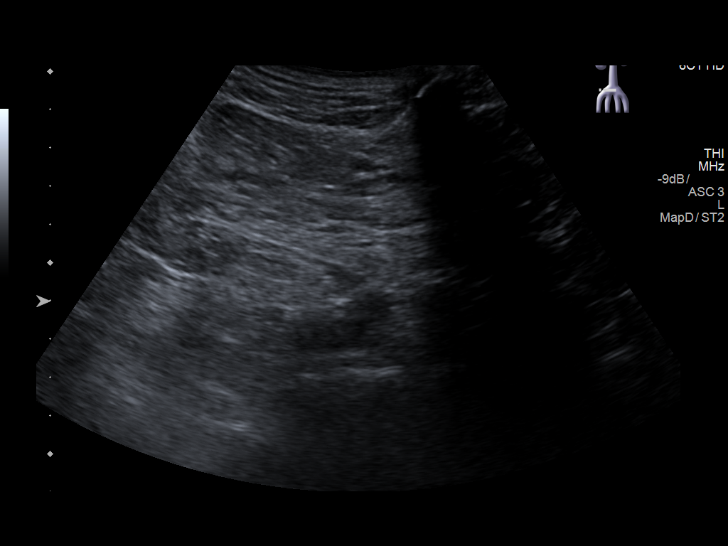
[im 27/58]
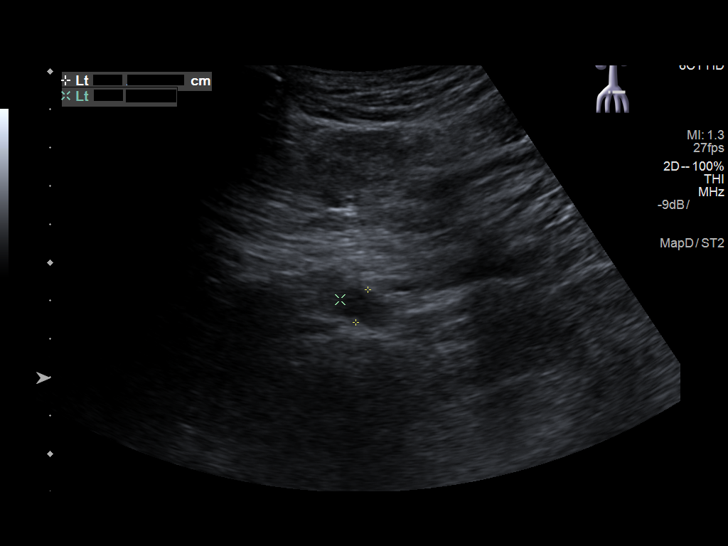
[im 31/58]
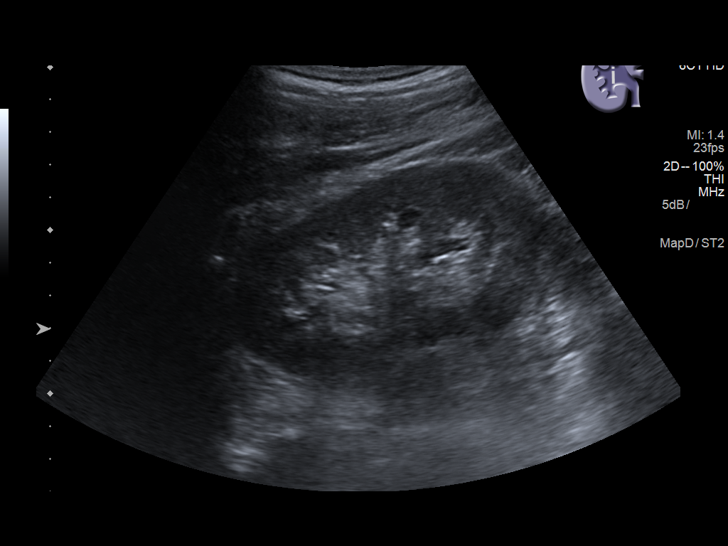
[im 36/58]
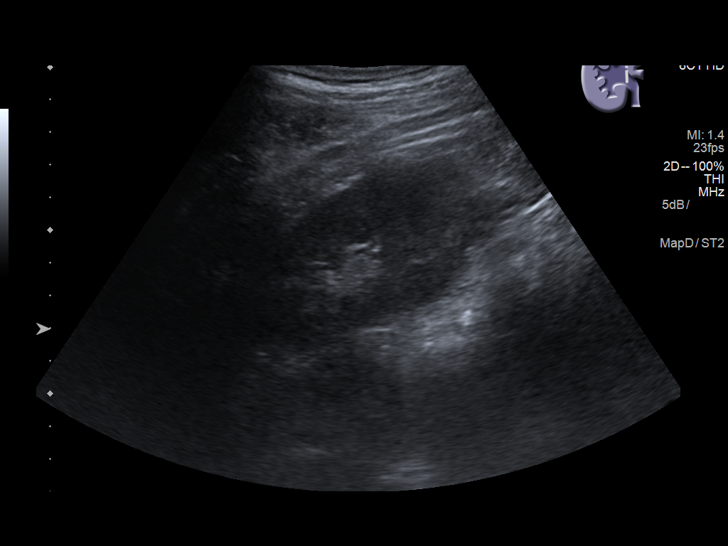
[im 39/58]
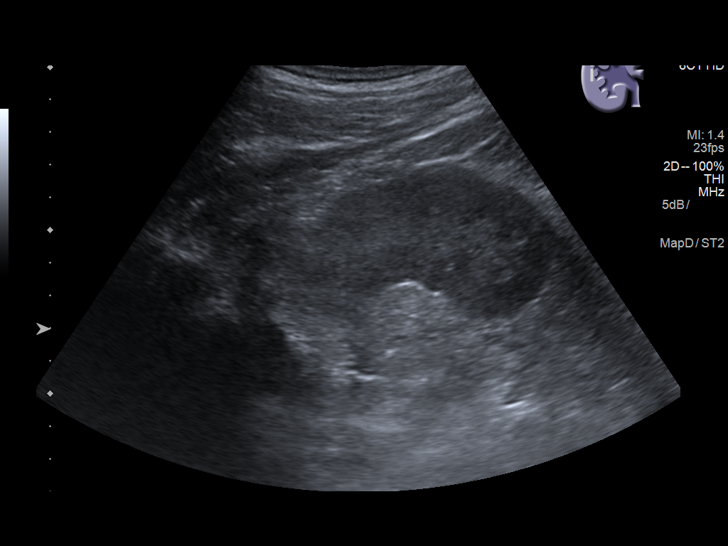
[im 43/58]
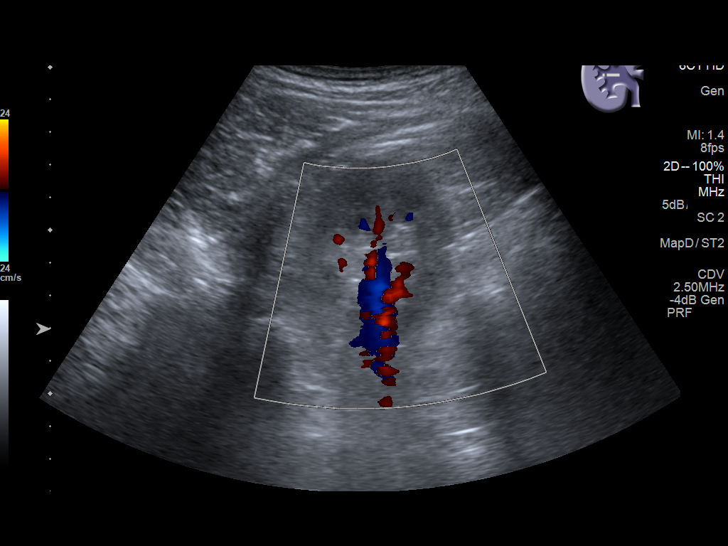
[im 48/58]
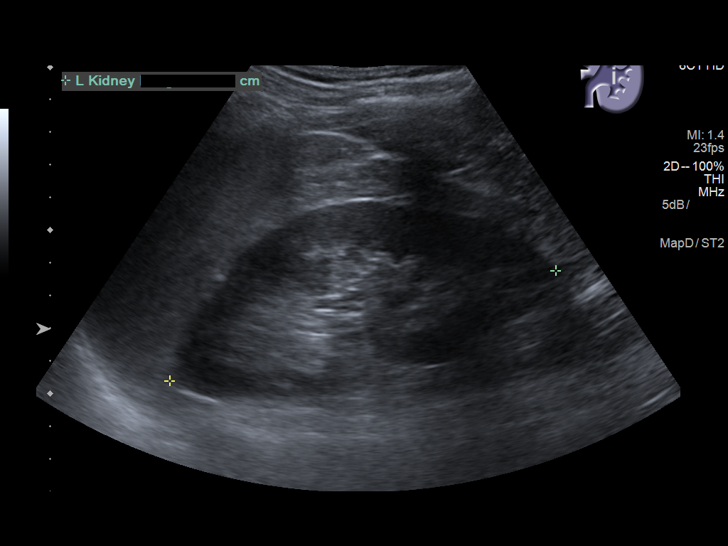
[im 53/58]
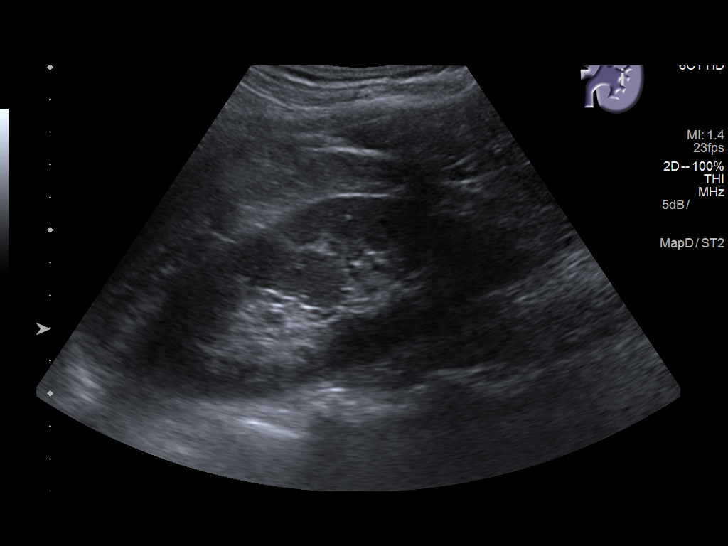
[im 58/58]
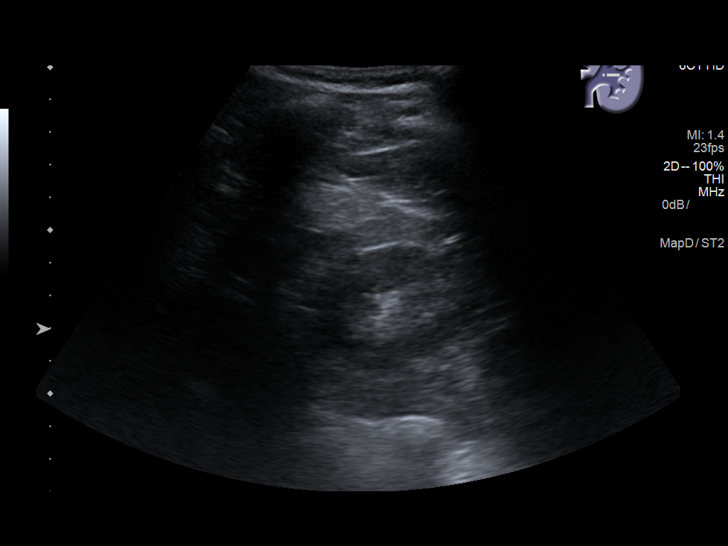

[14 of 25 positions shown; findings below may reference images not displayed]

FINDINGS: Abdominal Aorta

No aneurysm identified.

Maximum AP

Diameter:  2.0 cm

Maximum TRV

Diameter: 1.9 cm

Right Common Iliac Artery

Maximum 1.2 cm

Left Common Iliac Artery

Maximum 1.1 cm

IVC

No abnormality visualized.

Right Kidney

Length: 9.9 cm echogenicity within normal limits. No mass or
hydronephrosis visualized.

Left Kidney

Length: 12.3 cm echogenicity within normal limits. No mass or
hydronephrosis visualized.
IMPRESSION: No evidence of abdominal aortic aneurysm.

## 2019-01-24 NOTE — Telephone Encounter (Signed)
Left detailed message on machine that he has to go to his current doctor, fill out an ROI so the records are sent to [place]

## 2019-01-24 NOTE — Telephone Encounter (Signed)
Name of Caller: Jeffery Norton phone number: [phone]    Relationship to Patient: patient    Provider: Noe Gens     Practice:  [facility]     Chief Complaint/Reason for Call: Patient states his previous Drs office needs a medical records release form in order to fax over the records to this office for patient to establish care. Patient also needs this form sent for his wife as well Please advise, Drs office [facility] in [place] ph. # is [phone]. Please advise.    Best time of day caller can be reached: Any      Patient advised that office/PCP has 24-48 business hours to return their call: Yes

## 2019-03-01 ENCOUNTER — Ambulatory Visit
Admit: 2019-03-01 | Discharge: 2019-03-01 | Payer: PRIVATE HEALTH INSURANCE | Attending: Family Medicine | Primary: Family Medicine

## 2019-03-01 DIAGNOSIS — Z0000 Encounter for general adult medical examination without abnormal findings: Secondary | ICD-10-CM

## 2019-03-01 NOTE — Progress Notes (Signed)
Subjective:     Patient: Jeffery Norton is a 38 y.o. male     HPI     Jeffery Norton is a 38 year old white male with past medical history of allergic rhinitis who presents to establish care.  [place] ago.  He is married and has 2 children.  They are 5 and 2.  He has a history of allergic rhinitis which she is on Flonase for.  He is wondering if he should take this daily.  His mom has a history of Guillain-Barr.  He is unsure why she had this.  He is working in [employer].  He is exercising regularly.    Review of Systems   Constitutional: Negative for activity change, appetite change, chills, diaphoresis, fatigue, fever and unexpected weight change.   HENT: Negative for congestion, ear pain, postnasal drip, sinus pressure, sore throat and trouble swallowing.    Eyes: Negative for pain, redness and visual disturbance.   Respiratory: Negative for cough, shortness of breath and wheezing.    Cardiovascular: Negative for chest pain, palpitations and leg swelling.   Gastrointestinal: Negative for abdominal pain, blood in stool, constipation, diarrhea, nausea and vomiting.   Endocrine: Negative for cold intolerance, heat intolerance, polydipsia, polyphagia and polyuria.   Genitourinary: Negative for difficulty urinating, flank pain, frequency, hematuria and urgency.   Musculoskeletal: Negative for arthralgias, back pain, gait problem, joint swelling and myalgias.   Skin: Negative for color change and rash.   Neurological: Negative for dizziness, seizures, weakness and headaches.   Hematological: Negative for adenopathy. Does not bruise/bleed easily.   Psychiatric/Behavioral: Negative for agitation, confusion, decreased concentration, sleep disturbance and suicidal ideas. The patient is not nervous/anxious.         No Known Allergies     Current Outpatient Medications on File Prior to Visit   Medication Sig Dispense Refill   . fluticasone (FLONASE) 50 MCG/ACT nasal spray 1  spray by Each Nostril route daily       No current facility-administered medications on file prior to visit.       Past Medical History:   Diagnosis Date   . Allergic rhinitis       Past Surgical History:   Procedure Laterality Date   . EYE SURGERY Right     retinal tear in HS    . KNEE SURGERY Right 2000    ACL repair      Family History   Problem Relation Age of Onset   . Other Mother         gb   . Cancer Mother    . Other Father         aortic aneurysm    . No Known Problems Brother    . No Known Problems Brother    . No Known Problems Son    . No Known Problems Daughter      Social History     Tobacco Use   . Smoking status: Never Smoker   . Smokeless tobacco: Never Used   Substance Use Topics   . Alcohol use: Yes     Frequency: 2-4 times a month     Drinks per session: 5 or 6     Binge frequency: Monthly          Objective:     BP 110/78   Pulse 80   Temp 99 F (37.2 C)   Ht 5' 11.5" (1.816 m)  Wt 203 lb (92.1 kg)   BMI 27.92 kg/m     Physical Exam  Vitals signs and nursing note reviewed.   Constitutional:       General: He is not in acute distress.     Appearance: Normal appearance. He is well-developed. He is not ill-appearing, toxic-appearing or diaphoretic.   HENT:      Head: Normocephalic and atraumatic.      Right Ear: Tympanic membrane, ear canal and external ear normal.      Left Ear: Tympanic membrane, ear canal and external ear normal.      Mouth/Throat:      Mouth: Mucous membranes are moist.      Pharynx: Oropharynx is clear. No oropharyngeal exudate.   Eyes:      Conjunctiva/sclera: Conjunctivae normal.      Pupils: Pupils are equal, round, and reactive to light.   Neck:      Musculoskeletal: Normal range of motion and neck supple.      Thyroid: No thyromegaly.      Vascular: No JVD.   Cardiovascular:      Rate and Rhythm: Normal rate and regular rhythm.      Heart sounds: Normal heart sounds.   Pulmonary:      Effort: Pulmonary effort is normal. No respiratory distress.      Breath  sounds: Normal breath sounds. No wheezing or rales.   Chest:      Chest wall: No tenderness.   Abdominal:      General: Bowel sounds are normal. There is no distension.      Palpations: Abdomen is soft. There is no mass.      Tenderness: There is no abdominal tenderness. There is no guarding or rebound.      Comments: No hepatomegaly. No splenomegaly.    Musculoskeletal: Normal range of motion.         General: No tenderness.      Right lower leg: No edema.      Left lower leg: No edema.   Lymphadenopathy:      Cervical: No cervical adenopathy.   Skin:     General: Skin is warm and dry.   Neurological:      General: No focal deficit present.      Mental Status: He is alert and oriented to person, place, and time.      Cranial Nerves: No cranial nerve deficit.      Deep Tendon Reflexes: Reflexes are normal and symmetric.   Psychiatric:         Mood and Affect: Mood normal.         Behavior: Behavior normal.         Thought Content: Thought content normal.         Judgment: Judgment normal.         Assessment      1. Well adult exam    2. Non-seasonal allergic rhinitis due to other allergic trigger         Plan      1. Well adult exam  Would not recommend a flu shot with his mother's hx of GBS.    Encouraged avoidance of tobacco and alcohol, safe sexual practice. Healthy diet with plenty of fruits, vegetables, whole grains, and lean proteins. Exercise 3-5 times per week for 30-45 minutes. Wear seatbelts. Wear sunscreen. Reviewed age appropriate screening tests.     2. Non-seasonal allergic rhinitis due to other allergic trigger  Continue flonase  Vern Claude, DO  03/01/19  2:42 PM      Health Maintenance Due   Topic Date Due   . Varicella vaccine (1 of 2 - 2-dose childhood series) 11/14/1981   . HIV screen  11/15/1995   . DTaP/Tdap/Td vaccine (1 - Tdap) 11/15/1999   . Flu vaccine (1) 12/20/2018

## 2019-03-02 NOTE — Telephone Encounter (Signed)
Name of Caller: Gustin Zobrist phone number: [phone]    Relationship to Patient: patient    Provider: Dr. Noe Gens    Practice:  [facility]    Chief Complaint/Reason for Call: Lyman Bishop states he had an appointment yesterday, 03/01/19 with Dr. Noe Gens and she mentioned she never received his medical records.  Areli states he called his previous physician and was told the records were sent once he signed the ROI.  The office fax number was provided to [facility] for him to verify with his previous physician.  Jadden states he will have his previous physician re-fax his medication records to the office.  Ormond states he would like a callback to update him once his medical records are received.  Please contact Izaias and advise.    Best time of day caller can be reached: Any       Patient advised that office/PCP has 24-48 business hours to return their call: No

## 2019-03-20 NOTE — Telephone Encounter (Addendum)
Called patient, I let him know that we never received his records from his old PCP, I got the number to call the PCP and I will fax over an ROI and see if I can get the records here for the patient.   Faxed to [facility] family practice/ fax went through and paper work is in patients chart.

## 2019-06-12 MED ORDER — ALBUTEROL SULFATE HFA 108 (90 BASE) MCG/ACT IN AERS
108 | Freq: Four times a day (QID) | RESPIRATORY_TRACT | 3 refills | Status: AC | PRN
Start: 2019-06-12 — End: ?

## 2019-06-12 NOTE — Telephone Encounter (Signed)
From: Danella Maiers  To: Vern Claude, DO  Sent: 06/09/2019 5:53 PM EST  Subject: Prescription Question    Hi, I have my inhaler for an as needed use. I rarely use it unless I'm around cats for a long period of time. Here's a picture of my current one that I've had a while. I wanted to know if it was still good? There 45 sprays left. I'm going to be around a couple cats for a couple days so I wanted to make sure this was still good if I needed to use it. Thanks.

## 2019-06-12 NOTE — Telephone Encounter (Signed)
03/01/2019
